# Patient Record
Sex: Male | Born: 1985 | Race: Black or African American | Hispanic: No | Smoking: Former smoker
Health system: Southern US, Community
[De-identification: ages and names within clinical notes are randomized; demographics above are authoritative.]

## PROBLEM LIST (undated history)

## (undated) HISTORY — PX: APPENDECTOMY: SHX54

## (undated) HISTORY — PX: HERNIA REPAIR: SHX51

---

## 2019-07-06 ENCOUNTER — Emergency Department (HOSPITAL_COMMUNITY): Payer: Self-pay

## 2019-07-06 ENCOUNTER — Encounter (HOSPITAL_COMMUNITY): Payer: Self-pay | Admitting: Radiology

## 2019-07-06 ENCOUNTER — Emergency Department (HOSPITAL_COMMUNITY)
Admission: EM | Admit: 2019-07-06 | Discharge: 2019-07-06 | Disposition: A | Discharge status: Home / Self Care | Payer: Self-pay | Attending: Emergency Medicine | Admitting: Emergency Medicine

## 2019-07-06 DIAGNOSIS — R198 Other specified symptoms and signs involving the digestive system and abdomen: Secondary | ICD-10-CM

## 2019-07-06 DIAGNOSIS — K228 Other specified diseases of esophagus: Secondary | ICD-10-CM | POA: Insufficient documentation

## 2019-07-06 DIAGNOSIS — R05 Cough: Secondary | ICD-10-CM | POA: Insufficient documentation

## 2019-07-06 DIAGNOSIS — J029 Acute pharyngitis, unspecified: Secondary | ICD-10-CM | POA: Insufficient documentation

## 2019-07-06 DIAGNOSIS — R042 Hemoptysis: Secondary | ICD-10-CM | POA: Insufficient documentation

## 2019-07-06 LAB — BASIC METABOLIC PANEL
Anion gap: 13 (ref 5–15)
BUN: 11 mg/dL (ref 6–20)
CO2: 24 mmol/L (ref 22–32)
Calcium: 9.5 mg/dL (ref 8.9–10.3)
Chloride: 103 mmol/L (ref 98–111)
Creatinine, Ser: 0.75 mg/dL (ref 0.61–1.24)
GFR calc Af Amer: >60 mL/min (ref >60)
GFR calc non Af Amer: >60 mL/min (ref >60)
Glucose, Bld: 83 mg/dL (ref 70–99)
Potassium: 3.9 mmol/L (ref 3.5–5.1)
Sodium: 140 mmol/L (ref 135–145)

## 2019-07-06 LAB — CBC WITH DIFFERENTIAL/PLATELET
Abs Immature Granulocytes: 0 10*3/uL (ref 0.00–0.07)
Basophils Absolute: 0 10*3/uL (ref 0.0–0.1)
Basophils Relative: 0 %
Eosinophils Absolute: 0.1 10*3/uL (ref 0.0–0.5)
Eosinophils Relative: 3 %
HCT: 45.2 % (ref 39.0–52.0)
Hemoglobin: 14.6 g/dL (ref 13.0–17.0)
Immature Granulocytes: 0 %
Lymphocytes Relative: 63 %
Lymphs Abs: 2.3 10*3/uL (ref 0.7–4.0)
MCH: 26.2 pg (ref 26.0–34.0)
MCHC: 32.3 g/dL (ref 30.0–36.0)
MCV: 81.1 fL (ref 80.0–100.0)
Monocytes Absolute: 0.4 10*3/uL (ref 0.1–1.0)
Monocytes Relative: 9 %
Neutro Abs: 1 10*3/uL — ABNORMAL LOW (ref 1.7–7.7)
Neutrophils Relative %: 25 %
Platelets: 252 10*3/uL (ref 150–400)
RBC: 5.57 MIL/uL (ref 4.22–5.81)
RDW: 13.5 % (ref 11.5–15.5)
WBC: 3.7 10*3/uL — ABNORMAL LOW (ref 4.0–10.5)
nRBC: 0 % (ref 0.0–0.2)

## 2019-07-06 LAB — GROUP A STREP BY PCR: Group A Strep by PCR: NOT DETECTED

## 2019-07-06 MED ORDER — PREDNISONE 10 MG PO TABS: 20.0000 mg | ORAL_TABLET | Freq: Every day | ORAL | 0 refills | Status: AC

## 2019-07-06 MED ORDER — KETOROLAC TROMETHAMINE 30 MG/ML IJ SOLN
30.0000 mg | Freq: Once | INTRAMUSCULAR | Status: AC
  Administered 2019-07-06: 30 mg via INTRAVENOUS
  Filled 2019-07-06: qty 1

## 2019-07-06 MED ORDER — DEXAMETHASONE SODIUM PHOSPHATE 10 MG/ML IJ SOLN
10.0000 mg | Freq: Once | INTRAMUSCULAR | Status: AC
  Administered 2019-07-06: 10 mg via INTRAVENOUS
  Filled 2019-07-06: qty 1

## 2019-07-06 MED ORDER — IOHEXOL 300 MG/ML  SOLN
75.0000 mL | Freq: Once | INTRAMUSCULAR | Status: AC | PRN
  Administered 2019-07-06: 75 mL via INTRAVENOUS

## 2019-07-06 MED ORDER — ALUM & MAG HYDROXIDE-SIMETH 200-200-20 MG/5ML PO SUSP
30.0000 mL | Freq: Once | ORAL | Status: AC
  Administered 2019-07-06: 15:00:00 30 mL via ORAL
  Filled 2019-07-06: qty 30

## 2019-07-06 MED ORDER — LIDOCAINE VISCOUS HCL 2 % MT SOLN
15.0000 mL | Freq: Once | OROMUCOSAL | Status: AC
  Administered 2019-07-06: 15:00:00 15 mL via ORAL
  Filled 2019-07-06: qty 15

## 2019-07-06 NOTE — ED Notes (Signed)
Patient transported to X-ray 

## 2019-07-06 NOTE — ED Provider Notes (Signed)
[facility] Provider Note   CSN: [id number] Arrival date & time: 07/06/19  1357    History   Chief Complaint Chief Complaint  Patient presents with   Sore Throat   HPI [name] is a 33 y.o. male who presents for evaluation of sore throat.  Patient states he got off of work at approximately 330 this morning.  States he has had throat pain since then.  Patient states it hurts when he swallows and he feels like something is "stuck in my throat."  He denies prior history of strep infections.  Patient states when he was brushing his teeth he had an episode of hemoptysis.  States this was bright red in nature.  He denies any fever, chills, nausea, vomiting, neck pain, neck stiffness, congestion, rhinorrhea, nausea, vomiting, chest pain, shortness of breath, vomiting, diarrhea.  He has no risk factors for TB.  He denies any abdominal pain.  Rates his current throat pain a 5/10.  Denies additional aggravating or alleviating factors.  He has not tried any oral intake since the symptoms began however he was able to tolerate liquids after his dinner yesterday up until 3 AM this morning.  History obtained from patient and past medical records.  No interpreter is used.     HPI  History reviewed. No pertinent past medical history.  There are no active problems to display for this patient.  History reviewed     Home Medications    Prior to Admission medications   Not on File    Family History No family history on file.  Social History Social History   Tobacco Use   Smoking status: Not on file  Substance Use Topics   Alcohol use: Not on file   Drug use: Not on file     Allergies   Patient has no allergy information on record.   Review of Systems Review of Systems  Constitutional: Negative.   HENT: Positive for sore throat.   Respiratory: Positive for cough. Negative for apnea, choking, chest tightness, shortness of  breath, wheezing and stridor.        Hemoptysis  Cardiovascular: Negative.   Gastrointestinal: Negative.   Genitourinary: Negative.   Musculoskeletal: Negative.   Skin: Negative.   Neurological: Negative.   All other systems reviewed and are negative.   Physical Exam Updated Vital Signs BP (!) 135/92 (BP Location: Right Arm)    Pulse 70    Temp 98.4 F (36.9 C) (Oral)    Resp 16    SpO2 100%   Physical Exam Vitals signs and nursing note reviewed.  Constitutional:      General: He is not in acute distress.    Appearance: He is well-developed. He is not ill-appearing, toxic-appearing or diaphoretic.  HENT:     Head: Normocephalic and atraumatic.     Jaw: There is normal jaw occlusion.     Mouth/Throat:     Lips: Pink.     Mouth: Mucous membranes are moist.     Tongue: No lesions. Tongue does not deviate from midline.     Pharynx: Uvula midline. Posterior oropharyngeal erythema present. No oropharyngeal exudate or uvula swelling.     Tonsils: No tonsillar exudate or tonsillar abscesses. 2+ on the right. 2+ on the left.     Comments: Posterior oropharynx erythematous.  Uvula midline without deviation.  He does have bilateral tonsillar swelling at 2+ bilaterally.  Scant exudate on left tonsil.  Sublingual area soft.  No  drooling, dysphagia or trismus.  No facial swelling or intra oral swelling. Eyes:     Pupils: Pupils are equal, round, and reactive to light.  Neck:     Musculoskeletal: Full passive range of motion without pain, normal range of motion and neck supple.     Trachea: Trachea and phonation normal.     Comments: No neck stiffness or neck rigidity Cardiovascular:     Rate and Rhythm: Normal rate and regular rhythm.     Pulses: Normal pulses.     Heart sounds: Normal heart sounds.  Pulmonary:     Effort: Pulmonary effort is normal. No respiratory distress.     Breath sounds: Normal breath sounds and air entry.  Abdominal:     General: Bowel sounds are normal. There  is no distension.     Palpations: Abdomen is soft.     Tenderness: There is no abdominal tenderness.  Musculoskeletal: Normal range of motion.     Comments: Moves all 4 extremities without difficulty.  Skin:    General: Skin is warm and dry.     Capillary Refill: Capillary refill takes less than 2 seconds.     Comments: Brisk capillary refill.  No rashes or lesions.  Neurological:     Mental Status: He is alert.    ED Treatments / Results  Labs (all labs ordered are listed, but only abnormal results are displayed) Labs Reviewed  CBC WITH DIFFERENTIAL/PLATELET - Abnormal; Notable for the following components:      Result Value   WBC 3.7 (*)    Neutro Abs 1.0 (*)    All other components within normal limits  GROUP A STREP BY PCR  BASIC METABOLIC PANEL    EKG None  Radiology Dg Chest 2 View  Result Date: 07/06/2019 CLINICAL DATA:  Patient with rib pain. EXAM: CHEST - 2 VIEW COMPARISON:  None. FINDINGS: Normal cardiac and mediastinal contours. No consolidative pulmonary opacities. No pleural effusion or pneumothorax. Regional skeleton is unremarkable. IMPRESSION: No acute cardiopulmonary process. Electronically Signed   By: Lovey Newcomer M.D.   On: 07/06/2019 15:09    Procedures Procedures (including critical care time)  Medications Ordered in ED Medications  alum & mag hydroxide-simeth (MAALOX/MYLANTA) 200-200-20 MG/5ML suspension 30 mL (30 mLs Oral Given 07/06/19 1442)    And  lidocaine (XYLOCAINE) 2 % viscous mouth solution 15 mL (15 mLs Oral Given 07/06/19 1442)  dexamethasone (DECADRON) injection 10 mg (10 mg Intravenous Given 07/06/19 1551)  ketorolac (TORADOL) 30 MG/ML injection 30 mg (30 mg Intravenous Given 07/06/19 1551)  iohexol (OMNIPAQUE) 300 MG/ML solution 75 mL (75 mLs Intravenous Contrast Given 07/06/19 1625)    Initial Impression / Assessment and Plan / ED Course  I have reviewed the triage vital signs and the nursing notes.  Pertinent labs & imaging results  that were available during my care of the patient were reviewed by me and considered in my medical decision making (see chart for details).  33 year old male appears otherwise well presents for evaluation of sore throat and one episode of hemoptysis.  He is afebrile, nonseptic, non-ill-appearing.  Symptoms began approximately 10 hours PTA.  States he was able to eat dinner without difficulty yesterday was able to tolerate liquids after he last ate solid food however has not tried any p.o. intake since his sore throat began at 430 this morning.  States he did have one episode of hemoptysis while he was brushing his teeth.  His posterior oropharynx is visibly erythematous.  His  tonsils are 2+ bilaterally with scant exudate on left.  His uvula is midline without deviation.  Sublingual area soft.  No drooling, dysphagia or trismus.  He does feel like he has something "stuck in my throat."  Will obtain basic labs, strep, chest x-ray.  CBC with mild leukopenia at 3.7, to [facility] panel without electrolyte, renal or liver abnormality Strep test negative. Patient was able to tolerate GI cocktail without difficulty. Patient states he still has pain with swallowing.   Plan for CT soft tissue neck with contrast.  I have low suspicion for food bolus as cause of his sore throat given he was able to tolerate p.o. liquids after his last food intake.  He is able to tolerate his secretions without difficulty has tolerated his GI cocktail without difficulty.  1600: Care transferred to [facility], PA-C who will follow up on CT scan and PO challenge. She will determine ultimate treatment, plan and disposition.      Final Clinical Impressions(s) / ED Diagnoses   Final diagnoses:  Sore throat  Sensation of foreign body in esophagus    ED Discharge Orders    None       Zamzam Whinery A, PA-C 07/06/19 1646    Tegeler, Canary Brim, MD 07/07/19 0830

## 2019-07-06 NOTE — ED Provider Notes (Signed)
Care assumed from PA Henderly at shift change. Please see her note for full HPI  In short, patient is a 33 year old male who presents to the ED for sore throat x 1 day. Patient notes swallowing causes pain and it feels as if something is stuck in his throat. He had one episode of hemoptysis while brushing his teeth earlier this morning. He denies URI symptoms.   Physical Exam  BP (!) 135/92 (BP Location: Right Arm)   Pulse 70   Temp 98.4 F (36.9 C) (Oral)   Resp 16   SpO2 100%   Physical Exam Vitals signs and nursing note reviewed.  Constitutional:      General: He is not in acute distress.    Appearance: He is not toxic-appearing.  HENT:     Head: Normocephalic.     Mouth/Throat:     Mouth: Mucous membranes are moist.     Pharynx: Posterior oropharyngeal erythema present.     Comments: Uvula midline, but edematous. 2+ tonsils bilaterally. Erythematous posterior oropharyngeal. No abscess noted. Tolerating oral secretions without difficulty. Normal phonation. Eyes:     General:        Right eye: No discharge.        Left eye: No discharge.     Conjunctiva/sclera: Conjunctivae normal.  Neck:     Musculoskeletal: Neck supple.  Cardiovascular:     Rate and Rhythm: Normal rate and regular rhythm.     Pulses: Normal pulses.     Heart sounds: Normal heart sounds. No murmur. No friction rub. No gallop.   Pulmonary:     Effort: Pulmonary effort is normal.     Breath sounds: Normal breath sounds.  Abdominal:     General: Abdomen is flat. Bowel sounds are normal. There is no distension.     Palpations: Abdomen is soft.  Skin:    General: Skin is warm and dry.  Neurological:     General: No focal deficit present.     Mental Status: He is alert.     ED Course/Procedures     Procedures  MDM  CT scan of neck personally reviewed which demonstrates severe swelling of uvula which increases suspicion for angioedema rather than infectious process given absence of mucosal  enhancement. Patient denies new medications or new foods. Patient already given decadron 10mg .   5:54 PM Discussed case with Dr. Alvino Chapel who evaluated patient and agrees with assessment and plan.  Upon reevaluation, patient notes his pain has improved. Patient still denies difficulties breathing and is tolerating oral secretions without difficulty. Patient given food and water which he was able to swallow without difficulty. Will treat symptomatically with prednisone x 5 days. ENT and [place] [facility] number given to patient at discharge. Patient instructed to call ENT on Monday to schedule an appointment if symptoms do not improve. Strict ED precautions discussed with patient. Patient states understanding and agrees to plan. Patient discharged home in no acute distress and vitals within normal limits.        Romie Levee 07/07/19 0401    Davonna Belling, MD 07/07/19 1059

## 2019-07-06 NOTE — Discharge Instructions (Addendum)
I am sending you home with a steroid to help with inflammation. Take as prescribed. I have included the number for the ENT doctor. Please call on Monday to schedule an appointment if symptoms do not improve. The steroids you were given here in the ER and the ones you are taking at home should help significantly to reduce the swelling. I am also including a primary care doctor. You can get voltaren gel at your local pharmacy for your back pain. Over the counter is typically cheaper than a prescription for the gel. Return to the ER if you are unable to swallow, have a hard time breathing, or symptoms worsen.

## 2019-07-06 NOTE — ED Triage Notes (Signed)
Pt presents to the ED with throat pain that started yesterday and also reports that it feels like "something is stuck" in his throat. Pt reports he woke up an hour ago and reports that he coughed up a lot of blood. Pt also reporting lower back pain that started this morning.

## 2020-04-04 ENCOUNTER — Encounter (HOSPITAL_COMMUNITY): Payer: Self-pay | Admitting: Emergency Medicine

## 2020-04-04 ENCOUNTER — Other Ambulatory Visit: Payer: Self-pay

## 2020-04-04 ENCOUNTER — Emergency Department (HOSPITAL_COMMUNITY)
Admission: EM | Admit: 2020-04-04 | Discharge: 2020-04-05 | Disposition: A | Discharge status: Home / Self Care | Payer: BC Managed Care – PPO | Attending: Emergency Medicine | Admitting: Emergency Medicine

## 2020-04-04 DIAGNOSIS — R109 Unspecified abdominal pain: Secondary | ICD-10-CM | POA: Insufficient documentation

## 2020-04-04 DIAGNOSIS — Z5321 Procedure and treatment not carried out due to patient leaving prior to being seen by health care provider: Secondary | ICD-10-CM | POA: Diagnosis not present

## 2020-04-04 DIAGNOSIS — M549 Dorsalgia, unspecified: Secondary | ICD-10-CM | POA: Diagnosis not present

## 2020-04-04 LAB — URINALYSIS, ROUTINE W REFLEX MICROSCOPIC
Bacteria, UA: NONE SEEN
Bilirubin Urine: NEGATIVE
Glucose, UA: NEGATIVE mg/dL
Ketones, ur: NEGATIVE mg/dL
Leukocytes,Ua: NEGATIVE
Nitrite: NEGATIVE
Protein, ur: NEGATIVE mg/dL
Specific Gravity, Urine: 1.021 (ref 1.005–1.030)
pH: 5 (ref 5.0–8.0)

## 2020-04-04 LAB — COMPREHENSIVE METABOLIC PANEL
ALT: 57 U/L — ABNORMAL HIGH (ref 0–44)
AST: 40 U/L (ref 15–41)
Albumin: 4.3 g/dL (ref 3.5–5.0)
Alkaline Phosphatase: 57 U/L (ref 38–126)
Anion gap: 12 (ref 5–15)
BUN: 9 mg/dL (ref 6–20)
CO2: 28 mmol/L (ref 22–32)
Calcium: 9.6 mg/dL (ref 8.9–10.3)
Chloride: 99 mmol/L (ref 98–111)
Creatinine, Ser: 0.9 mg/dL (ref 0.61–1.24)
GFR calc Af Amer: >60 mL/min (ref >60)
GFR calc non Af Amer: >60 mL/min (ref >60)
Glucose, Bld: 116 mg/dL — ABNORMAL HIGH (ref 70–99)
Potassium: 3.4 mmol/L — ABNORMAL LOW (ref 3.5–5.1)
Sodium: 139 mmol/L (ref 135–145)
Total Bilirubin: 0.7 mg/dL (ref 0.3–1.2)
Total Protein: 7.1 g/dL (ref 6.5–8.1)

## 2020-04-04 LAB — LIPASE, BLOOD: Lipase: 28 U/L (ref 11–51)

## 2020-04-04 LAB — CBC
HCT: 44.2 % (ref 39.0–52.0)
Hemoglobin: 14 g/dL (ref 13.0–17.0)
MCH: 25.8 pg — ABNORMAL LOW (ref 26.0–34.0)
MCHC: 31.7 g/dL (ref 30.0–36.0)
MCV: 81.5 fL (ref 80.0–100.0)
Platelets: 203 10*3/uL (ref 150–400)
RBC: 5.42 MIL/uL (ref 4.22–5.81)
RDW: 13 % (ref 11.5–15.5)
WBC: 2.9 10*3/uL — ABNORMAL LOW (ref 4.0–10.5)
nRBC: 0 % (ref 0.0–0.2)

## 2020-04-04 MED ORDER — SODIUM CHLORIDE 0.9% FLUSH: 3.0000 mL | Freq: Once | INTRAVENOUS | Status: DC

## 2020-04-04 NOTE — ED Triage Notes (Signed)
Pt coming from home. Pt complaining of abdominal pain and back pain for 1 week. Pt states had same approx 1 month ago. Pt states took ibuprofen to alleviate pain then. Pt A&Ox4. VSS.

## 2020-04-05 NOTE — ED Notes (Signed)
Pt stated that he is not waiting any longer. Pt has walked out of the ED lobby.

## 2020-04-15 ENCOUNTER — Ambulatory Visit (HOSPITAL_COMMUNITY)
Admission: EM | Admit: 2020-04-15 | Discharge: 2020-04-15 | Disposition: A | Discharge status: Home / Self Care | Payer: BC Managed Care – PPO | Attending: Family Medicine | Admitting: Family Medicine

## 2020-04-15 ENCOUNTER — Ambulatory Visit (INDEPENDENT_AMBULATORY_CARE_PROVIDER_SITE_OTHER): Payer: BC Managed Care – PPO

## 2020-04-15 ENCOUNTER — Encounter (HOSPITAL_COMMUNITY): Payer: Self-pay | Admitting: Emergency Medicine

## 2020-04-15 ENCOUNTER — Other Ambulatory Visit: Payer: Self-pay

## 2020-04-15 DIAGNOSIS — M545 Low back pain, unspecified: Secondary | ICD-10-CM

## 2020-04-15 DIAGNOSIS — Z20822 Contact with and (suspected) exposure to covid-19: Secondary | ICD-10-CM | POA: Insufficient documentation

## 2020-04-15 DIAGNOSIS — R1030 Lower abdominal pain, unspecified: Secondary | ICD-10-CM

## 2020-04-15 DIAGNOSIS — B360 Pityriasis versicolor: Secondary | ICD-10-CM | POA: Diagnosis present

## 2020-04-15 LAB — COMPREHENSIVE METABOLIC PANEL
ALT: 69 U/L — ABNORMAL HIGH (ref 0–44)
AST: 41 U/L (ref 15–41)
Albumin: 4.5 g/dL (ref 3.5–5.0)
Alkaline Phosphatase: 60 U/L (ref 38–126)
Anion gap: 9 (ref 5–15)
BUN: 11 mg/dL (ref 6–20)
CO2: 29 mmol/L (ref 22–32)
Calcium: 9.9 mg/dL (ref 8.9–10.3)
Chloride: 100 mmol/L (ref 98–111)
Creatinine, Ser: 0.85 mg/dL (ref 0.61–1.24)
GFR calc Af Amer: >60 mL/min (ref >60)
GFR calc non Af Amer: >60 mL/min (ref >60)
Glucose, Bld: 93 mg/dL (ref 70–99)
Potassium: 3.9 mmol/L (ref 3.5–5.1)
Sodium: 138 mmol/L (ref 135–145)
Total Bilirubin: 0.4 mg/dL (ref 0.3–1.2)
Total Protein: 7.5 g/dL (ref 6.5–8.1)

## 2020-04-15 LAB — POCT URINALYSIS DIPSTICK, ED / UC
Bilirubin Urine: NEGATIVE
Glucose, UA: NEGATIVE mg/dL
Ketones, ur: NEGATIVE mg/dL
Leukocytes,Ua: NEGATIVE
Nitrite: NEGATIVE
Protein, ur: NEGATIVE mg/dL
Specific Gravity, Urine: >=1.03 (ref 1.005–1.030)
Urobilinogen, UA: 0.2 mg/dL (ref 0.0–1.0)
pH: 5.5 (ref 5.0–8.0)

## 2020-04-15 LAB — CBC WITH DIFFERENTIAL/PLATELET
Abs Immature Granulocytes: 0.03 10*3/uL (ref 0.00–0.07)
Basophils Absolute: 0 10*3/uL (ref 0.0–0.1)
Basophils Relative: 0 %
Eosinophils Absolute: 0.1 10*3/uL (ref 0.0–0.5)
Eosinophils Relative: 1 %
HCT: 43.8 % (ref 39.0–52.0)
Hemoglobin: 14.2 g/dL (ref 13.0–17.0)
Immature Granulocytes: 0 %
Lymphocytes Relative: 33 %
Lymphs Abs: 2.3 10*3/uL (ref 0.7–4.0)
MCH: 26.2 pg (ref 26.0–34.0)
MCHC: 32.4 g/dL (ref 30.0–36.0)
MCV: 80.8 fL (ref 80.0–100.0)
Monocytes Absolute: 0.4 10*3/uL (ref 0.1–1.0)
Monocytes Relative: 6 %
Neutro Abs: 4.1 10*3/uL (ref 1.7–7.7)
Neutrophils Relative %: 60 %
Platelets: 273 10*3/uL (ref 150–400)
RBC: 5.42 MIL/uL (ref 4.22–5.81)
RDW: 12.8 % (ref 11.5–15.5)
WBC: 6.9 10*3/uL (ref 4.0–10.5)
nRBC: 0 % (ref 0.0–0.2)

## 2020-04-15 MED ORDER — KETOROLAC TROMETHAMINE 60 MG/2ML IM SOLN
INTRAMUSCULAR | Status: AC
  Filled 2020-04-15: qty 2

## 2020-04-15 MED ORDER — KETOCONAZOLE 1 % EX SHAM: MEDICATED_SHAMPOO | CUTANEOUS | 0 refills | Status: AC

## 2020-04-15 MED ORDER — KETOROLAC TROMETHAMINE 60 MG/2ML IM SOLN
60.0000 mg | Freq: Once | INTRAMUSCULAR | Status: AC
  Administered 2020-04-15: 60 mg via INTRAMUSCULAR

## 2020-04-15 NOTE — ED Notes (Signed)
Patient transported to X-ray 

## 2020-04-15 NOTE — ED Provider Notes (Addendum)
[facility]    CSN: [id number] Arrival date & time: 04/15/20  1101      History   Chief Complaint Chief Complaint  Patient presents with  . Back Pain    HPI Garrett Rosales is a 34 y.o. male.   Garrett Rosales presents with complaints of low abdominal pain which radiates around sides/flank to low back. Low back pain radiates up back. Back pain started a month ago. Abdominal pain worsened this morning and more localized to lower abdomen bilaterally. Abdominal pain started at bilateral flanks a few days ago. No nausea, vomiting or diarrhea. No pain with urination. Walking worsens abdominal pain and back pain. No blood to urine. Temp last week, unsure how high, no fevers since. No previous similar abdominal pain, no previous surgeries. Took over the counter pain medications for pain which haven't helped- took tylenol last night. Denies any constipation. Has been able to eat and drink. No known injury to back. Went to the ER 8/7 but LWBS due to wait time, with basic labs collected at that time. He feels he has since worsened.    ROS per HPI, negative if not otherwise mentioned.      History reviewed. No pertinent past medical history.  There are no problems to display for this patient.   History reviewed. No pertinent surgical history.     Home Medications    Prior to Admission medications   Medication Sig Start Date End Date Taking? Authorizing Provider  KETOCONAZOLE, TOPICAL, 1 % SHAM Apply to affected rash, leave on for 5 minutes and rinse. Three times a week until it has resolved. 04/15/20   Georgetta Haber, NP    Family History History reviewed. No pertinent family history.  Social History Social History   Tobacco Use  . Smoking status: Former Games developer  . Smokeless tobacco: Never Used  Substance Use Topics  . Alcohol use: Not Currently  . Drug use: Never     Allergies   Aspirin   Review of Systems Review of Systems   Physical  Exam Triage Vital Signs ED Triage Vitals  Enc Vitals Group     BP 04/15/20 1253 121/87     Pulse Rate 04/15/20 1253 70     Resp 04/15/20 1253 20     Temp 04/15/20 1253 98.4 F (36.9 C)     Temp Source 04/15/20 1253 Oral     SpO2 04/15/20 1253 99 %     Weight --      Height --      Head Circumference --      Peak Flow --      Pain Score 04/15/20 1249 9     Pain Loc --      Pain Edu? --      Excl. in GC? --    No data found.  Updated Vital Signs BP 121/87 (BP Location: Left Arm)   Pulse 70   Temp 98.4 F (36.9 C) (Oral)   Resp 20   SpO2 99%    Physical Exam Constitutional:      Appearance: He is well-developed.  Cardiovascular:     Rate and Rhythm: Normal rate.  Pulmonary:     Effort: Pulmonary effort is normal.  Abdominal:     General: There is distension.     Tenderness: There is abdominal tenderness. There is right CVA tenderness and left CVA tenderness. There is no guarding or rebound.     Comments: Movements seem to worsen pain  Skin:    General: Skin is warm and dry.     Comments: Hypopigmented lesions to chest, neck and to beard  Neurological:     Mental Status: He is alert and oriented to person, place, and time.      UC Treatments / Results  Labs (all labs ordered are listed, but only abnormal results are displayed) Labs Reviewed  COMPREHENSIVE METABOLIC PANEL - Abnormal; Notable for the following components:      Result Value   ALT 69 (*)    All other components within normal limits  POCT URINALYSIS DIPSTICK, ED / UC - Abnormal; Notable for the following components:   Hgb urine dipstick TRACE (*)    All other components within normal limits  URINE CULTURE  CBC WITH DIFFERENTIAL/PLATELET    EKG   Radiology DG Abd 2 Views  Result Date: 04/15/2020 CLINICAL DATA:  Abdominal pain for 1 week. EXAM: ABDOMEN - 2 VIEW COMPARISON:  None. FINDINGS: The bowel gas pattern is normal. There is no evidence of free air. No radio-opaque calculi or other  significant radiographic abnormality is seen. IMPRESSION: Negative exam. Electronically Signed   By: Drusilla Kanner M.D.   On: 04/15/2020 13:51    Procedures Procedures (including critical care time)  Medications Ordered in UC Medications  ketorolac (TORADOL) injection 60 mg (60 mg Intramuscular Given 04/15/20 1334)    Initial Impression / Assessment and Plan / UC Course  I have reviewed the triage vital signs and the nursing notes.  Pertinent labs & imaging results that were available during my care of the patient were reviewed by me and considered in my medical decision making (see chart for details).     Persistent low abdominal pain, 10/10 in severity initially, still 8/10 after Toradol provided. Pain with getting off of the exam table, pain with palpation. Trace hgb to urine, consistent with previous UA in the ED. WBC remains stable, and afebrile. Abdominal film normal here today. Nephrolithiasis vs diverticulitis vs appendicitis considered. With severity of pain I do recommend further evaluation, likely will need further imaging, in the ER. Patient states due to wait times he may delay depending on his pain level. Risks of this discussed. Patient verbalized understanding and agreeable to plan.    After completion of visit patient states he is required to get covid testing before returning to work, collected and pending as well.   Final Clinical Impressions(s) / UC Diagnoses   Final diagnoses:  Lower abdominal pain  Acute bilateral low back pain without sciatica  Tinea versicolor     Discharge Instructions     I am worried about your persistent abdominal pain.  Your labs and work up today are generally unremarkable, therefore I do feel that further evaluation of your pain is warranted, especially if it worsens despite the medication we have given you here today.     ED Prescriptions    Medication Sig Dispense Auth. Provider   KETOCONAZOLE, TOPICAL, 1 % SHAM Apply to  affected rash, leave on for 5 minutes and rinse. Three times a week until it has resolved. 200 mL Georgetta Haber, NP     PDMP not reviewed this encounter.   Georgetta Haber, NP 04/15/20 1511    Georgetta Haber, NP 04/15/20 1520

## 2020-04-15 NOTE — ED Notes (Signed)
Pt first discharge was undo as the pt requested a COVID test. Pt declined COVID test when the provider asked.

## 2020-04-15 NOTE — ED Triage Notes (Signed)
Patient went to ED on 8/7.  At that time complained of back pain and abdominal pain that started one week prior to this encounter.  Patient did not stay to be seen.   No nausea, no vomiting.  No diarrhea.

## 2020-04-15 NOTE — Discharge Instructions (Signed)
I am worried about your persistent abdominal pain.  Your labs and work up today are generally unremarkable, therefore I do feel that further evaluation of your pain is warranted, especially if it worsens despite the medication we have given you here today.

## 2020-04-16 LAB — URINE CULTURE: Culture: NO GROWTH

## 2020-04-16 LAB — SARS CORONAVIRUS 2 (TAT 6-24 HRS): SARS Coronavirus 2: NEGATIVE

## 2020-09-26 IMAGING — CT CT NECK W/ CM
4 of 5 series · 15 of 33 positions shown, 17 images · IV contrast (Omni 300)
Comparison: Chest radiographs earlier today.

CLINICAL DATA: 33-year-old male with sore throat, stridor, trouble
swallowing.

EXAM:
CT NECK WITH CONTRAST
TECHNIQUE: Multidetector CT imaging of the neck was performed using the
standard protocol following the bolus administration of intravenous
contrast.
CONTRAST:  75mL OMNIPAQUE IOHEXOL 300 MG/ML  SOLN

[Series 3: neck 2.0 st · axial · 0.50mm/px · z∈[-296,-152]mm · 4 of 122 slices shown, 5 images (1 of 3)]
[im 25/122  soft-tissue]
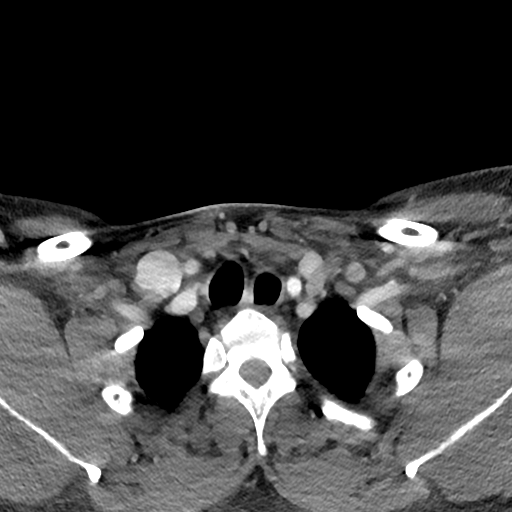
[im 25/122  bone]
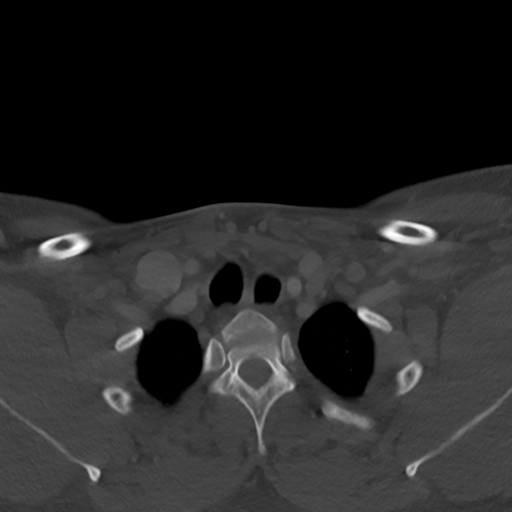
[im 49/122  bone]
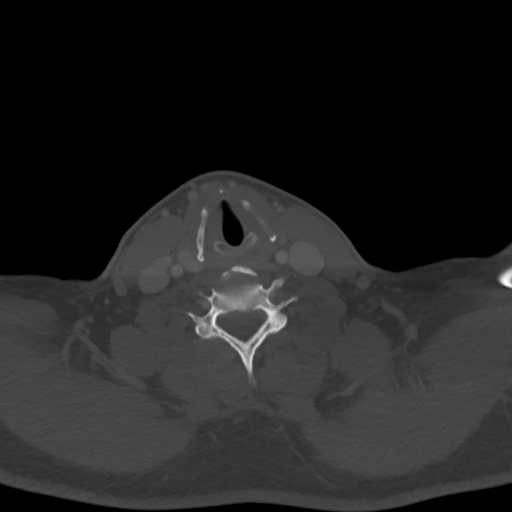
[im 73/122  bone]
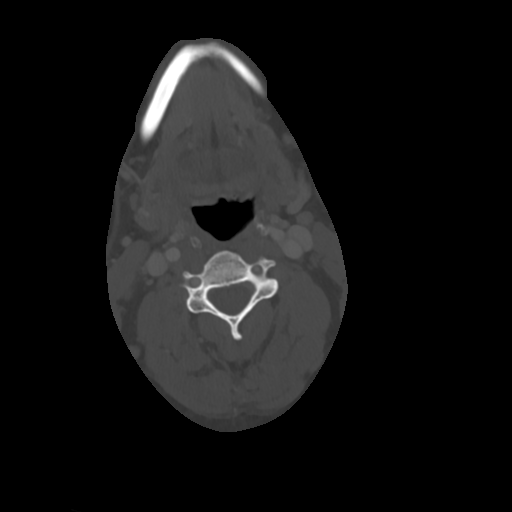
[im 97/122  bone]
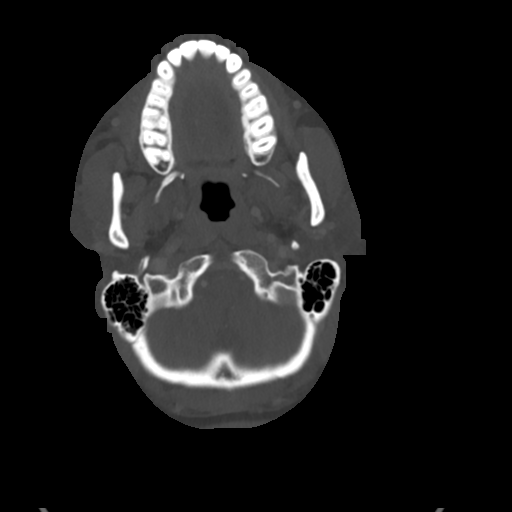

[Series 6: neck 2.0 st · sagittal · 0.52mm/px · 5 of 83 slices shown, 6 images (2 of 3)]
[im 28/83  bone]
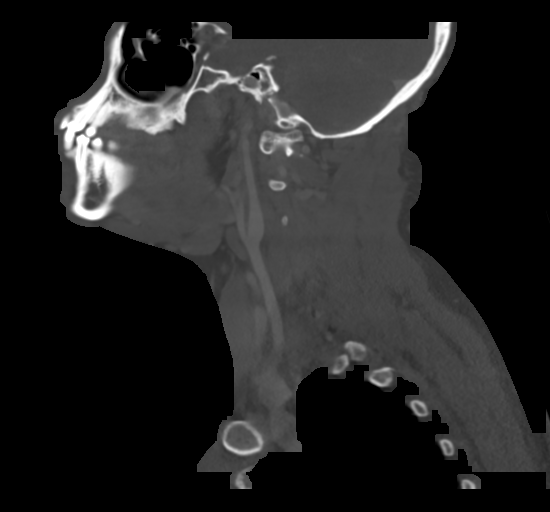
[im 35/83  bone]
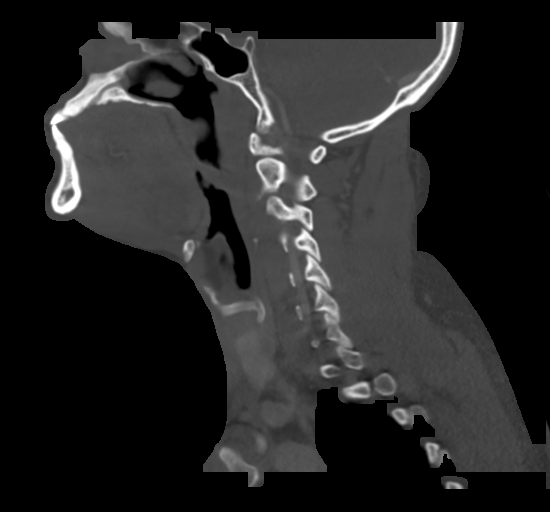
[im 42/83  soft-tissue]
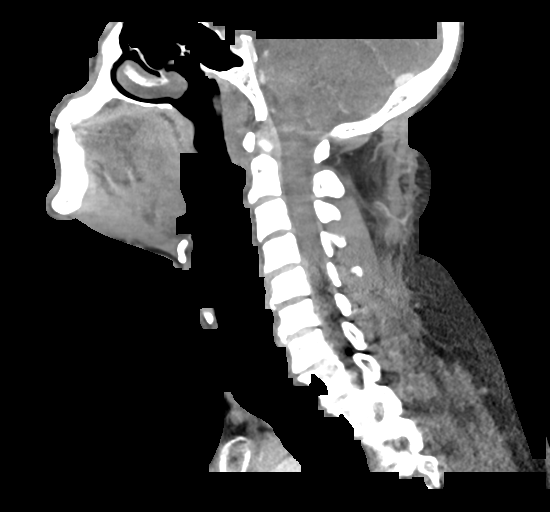
[im 42/83  bone]
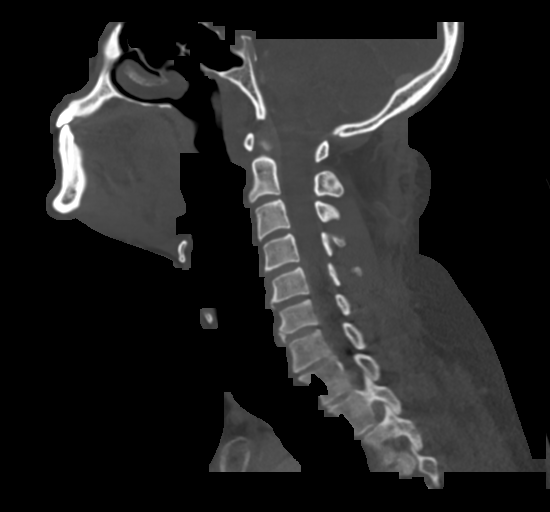
[im 48/83  bone]
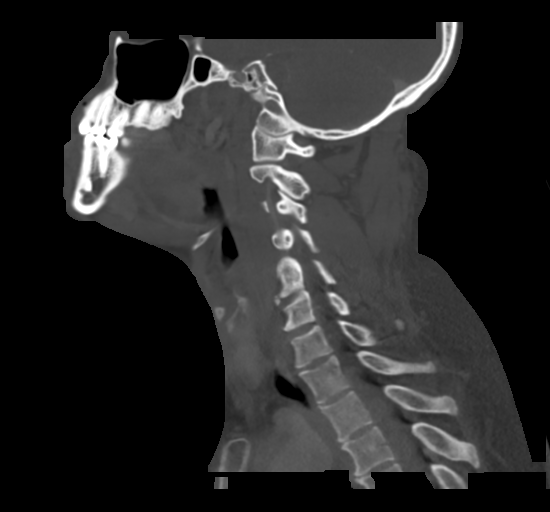
[im 55/83  bone]
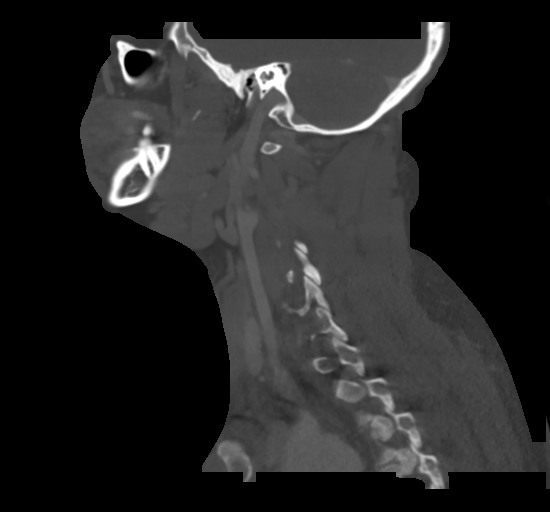

[Series 7: neck 2.0 st · coronal · 0.51mm/px · 3 of 116 slices shown (3 of 3)]
[im 24/116  bone]
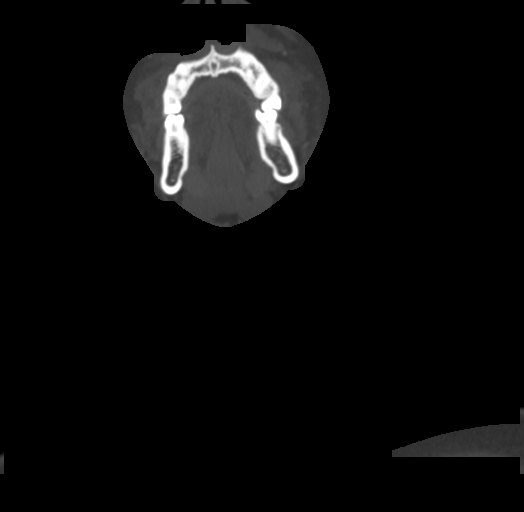
[im 47/116  bone]
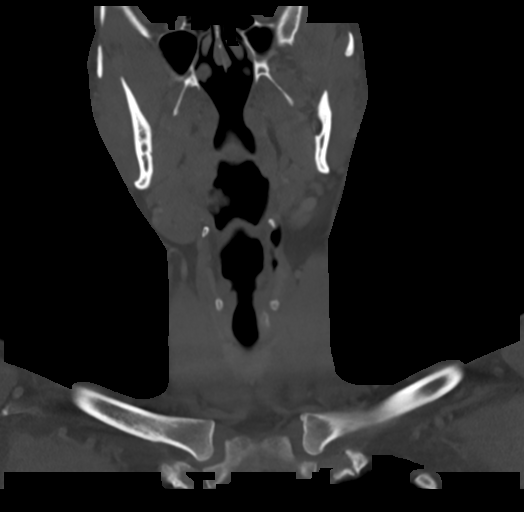
[im 70/116  bone]
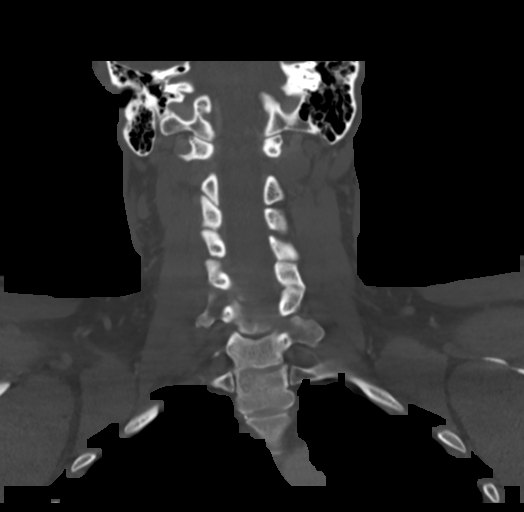

[Series 8: neck 2.0 st orthogonal · axial · 0.39mm/px · z∈[-322,-220]mm · 3 of 131 slices shown]
[im 27/131  bone]
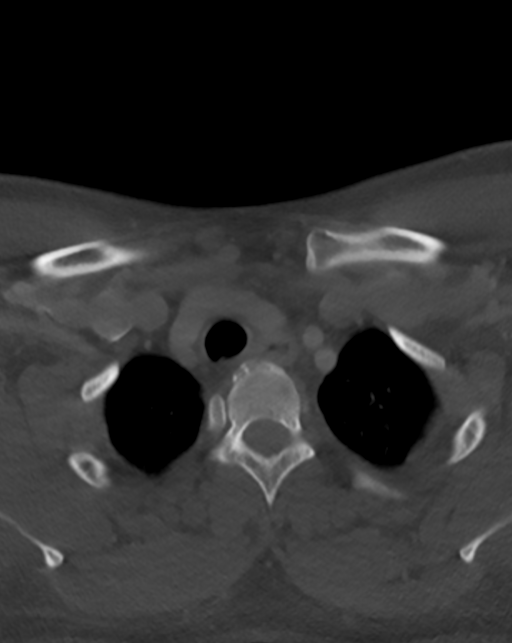
[im 53/131  bone]
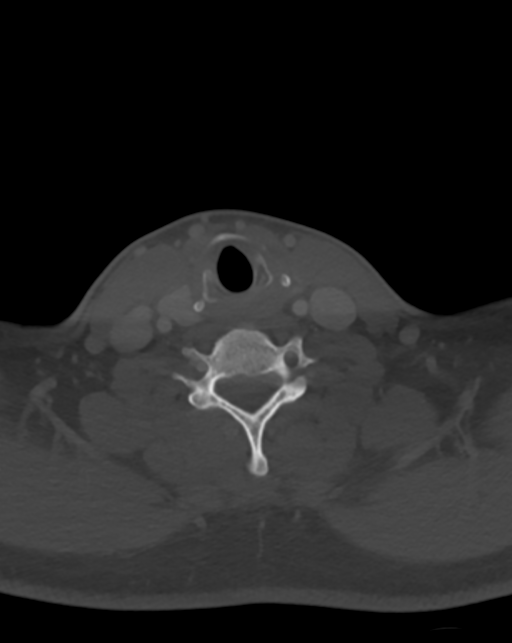
[im 79/131  bone]
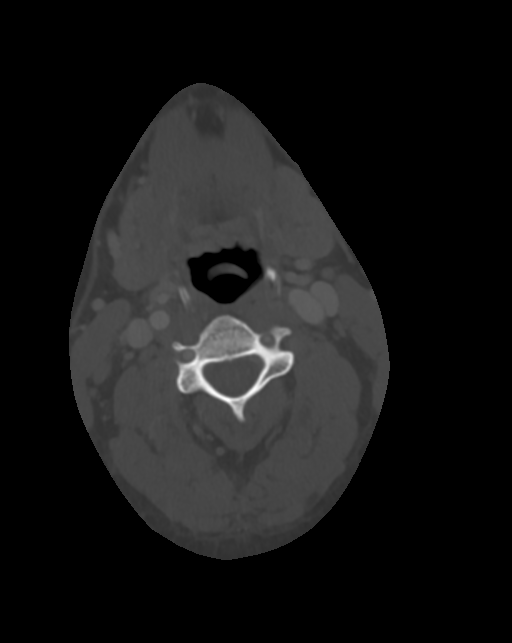

[15 of 33 positions shown; findings below may reference images not displayed]

FINDINGS: Pharynx and larynx: Negative larynx. The epiglottis is within normal
limits.

The epiglottis is swollen, up to 12 millimeters diameter on series
3, image 41. Otherwise relatively normal pharyngeal soft tissue
contours. No other swelling of the soft palate.

No palatine tonsil or adenoid hyperenhancement. The parapharyngeal
spaces remain normal. Mild if any hyperenhancement of the lingual
tonsils. Negative retropharyngeal space.

Salivary glands: Negative sublingual space. Submandibular glands
appear symmetric with mild ductal ectasia at the hila. No sublingual
enlargement of the submandibular ducts. No sialolithiasis
identified. Parotid glands appear symmetric and normal.

Thyroid: Negative.

Lymph nodes: No cervical lymphadenopathy. Bilateral cervical lymph
nodes appear symmetric and within normal limits.

Vascular: Major vascular structures in the neck and at the skull
base appear patent and normal. The right vertebral artery is
dominant.

Limited intracranial: Negative.

Visualized orbits: Negative.

Mastoids and visualized paranasal sinuses: Right maxillary sinus
mucous retention cyst, otherwise clear. Tympanic cavities and
mastoids are clear.

Skeleton: No dental abnormality identified. No osseous abnormality
identified.

Upper chest: Negative visible lung apices and superior mediastinum.
Incidental mild gaseous distension of the esophagus at the thoracic
inlet.
IMPRESSION: 1. Moderate to severe swelling of the uvula (12 mm diameter), but
otherwise the pharynx and larynx are within normal limits. Given the
absence of mucosal enhancement this might reflect angioedema of the
palate rather than infection.
2. Otherwise negative CT appearance of the neck.

## 2020-09-29 ENCOUNTER — Ambulatory Visit (HOSPITAL_COMMUNITY)
Admission: EM | Admit: 2020-09-29 | Discharge: 2020-09-29 | Disposition: A | Discharge status: Home / Self Care | Payer: BC Managed Care – PPO | Attending: Emergency Medicine | Admitting: Emergency Medicine

## 2020-09-29 ENCOUNTER — Encounter (HOSPITAL_COMMUNITY): Payer: Self-pay

## 2020-09-29 ENCOUNTER — Ambulatory Visit (INDEPENDENT_AMBULATORY_CARE_PROVIDER_SITE_OTHER): Payer: BC Managed Care – PPO

## 2020-09-29 ENCOUNTER — Other Ambulatory Visit: Payer: Self-pay

## 2020-09-29 DIAGNOSIS — R0781 Pleurodynia: Secondary | ICD-10-CM | POA: Diagnosis not present

## 2020-09-29 DIAGNOSIS — W19XXXA Unspecified fall, initial encounter: Secondary | ICD-10-CM | POA: Diagnosis not present

## 2020-09-29 DIAGNOSIS — S20211A Contusion of right front wall of thorax, initial encounter: Secondary | ICD-10-CM | POA: Diagnosis not present

## 2020-09-29 DIAGNOSIS — S0990XA Unspecified injury of head, initial encounter: Secondary | ICD-10-CM

## 2020-09-29 DIAGNOSIS — M62838 Other muscle spasm: Secondary | ICD-10-CM | POA: Diagnosis not present

## 2020-09-29 MED ORDER — TIZANIDINE HCL 4 MG PO TABS: 4.0000 mg | ORAL_TABLET | Freq: Three times a day (TID) | ORAL | 0 refills | Status: AC | PRN

## 2020-09-29 MED ORDER — IBUPROFEN 600 MG PO TABS: 600.0000 mg | ORAL_TABLET | Freq: Four times a day (QID) | ORAL | 0 refills | Status: AC | PRN

## 2020-09-29 NOTE — ED Triage Notes (Signed)
Pt presents with generalized body pain after falling on ice X 3 days ago.
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
## 2020-09-29 NOTE — Discharge Instructions (Addendum)
Your x-ray was negative for fracture.  Take 600 mg of ibuprofen combined with 1000 mg Tylenol together 3-4 times a day as needed for pain.  Zanaflex will help with the muscle spasms particularly in your neck.  Warm compresses, gentle massage to your neck.  Below is a list of primary care practices who are taking new patients for you to follow-up with.  [facility] internal medicine clinic Ground Floor - [facility], [address] [place], [place], [place] [address] [phone]  [facility] Primary Care at [facility] [address] [place], [place] [address] [phone]  [facility] [address] [place], [place] [address] [phone]  [facility] [phone] [address] [place] [address]  [facility]: [address] [place] [address]  [phone]  [facility]: [address] [place] [address]   [phone]  [facility]: [address] [place] [address]  [phone]  [facility] : [address] [place] [address] [phone]  [facility] Primary Care: [address] [place] [address] [phone]  [facility]: [address] [place] [address] [phone]  Dr. Oneal Grout [address] [facility] [place] [address]  [phone]  Dr. Jackie Plum, [facility]. [address] [place], [place] [address]  [phone]  Go to www.goodrx.com to look up your medications. This will give you a list of where you can find your prescriptions at the most affordable prices. Or ask the pharmacist what the cash price is, or if they have any other discount programs available to help make your medication more affordable.  This can be less expensive than what you would pay with insurance.

## 2021-07-07 IMAGING — DX DG ABDOMEN 2V
2 series · 2 of 2 positions shown · non-contrast
Comparison: None.

CLINICAL DATA: Abdominal pain for 1 week.

EXAM:
ABDOMEN - 2 VIEW

[abdomen erect]
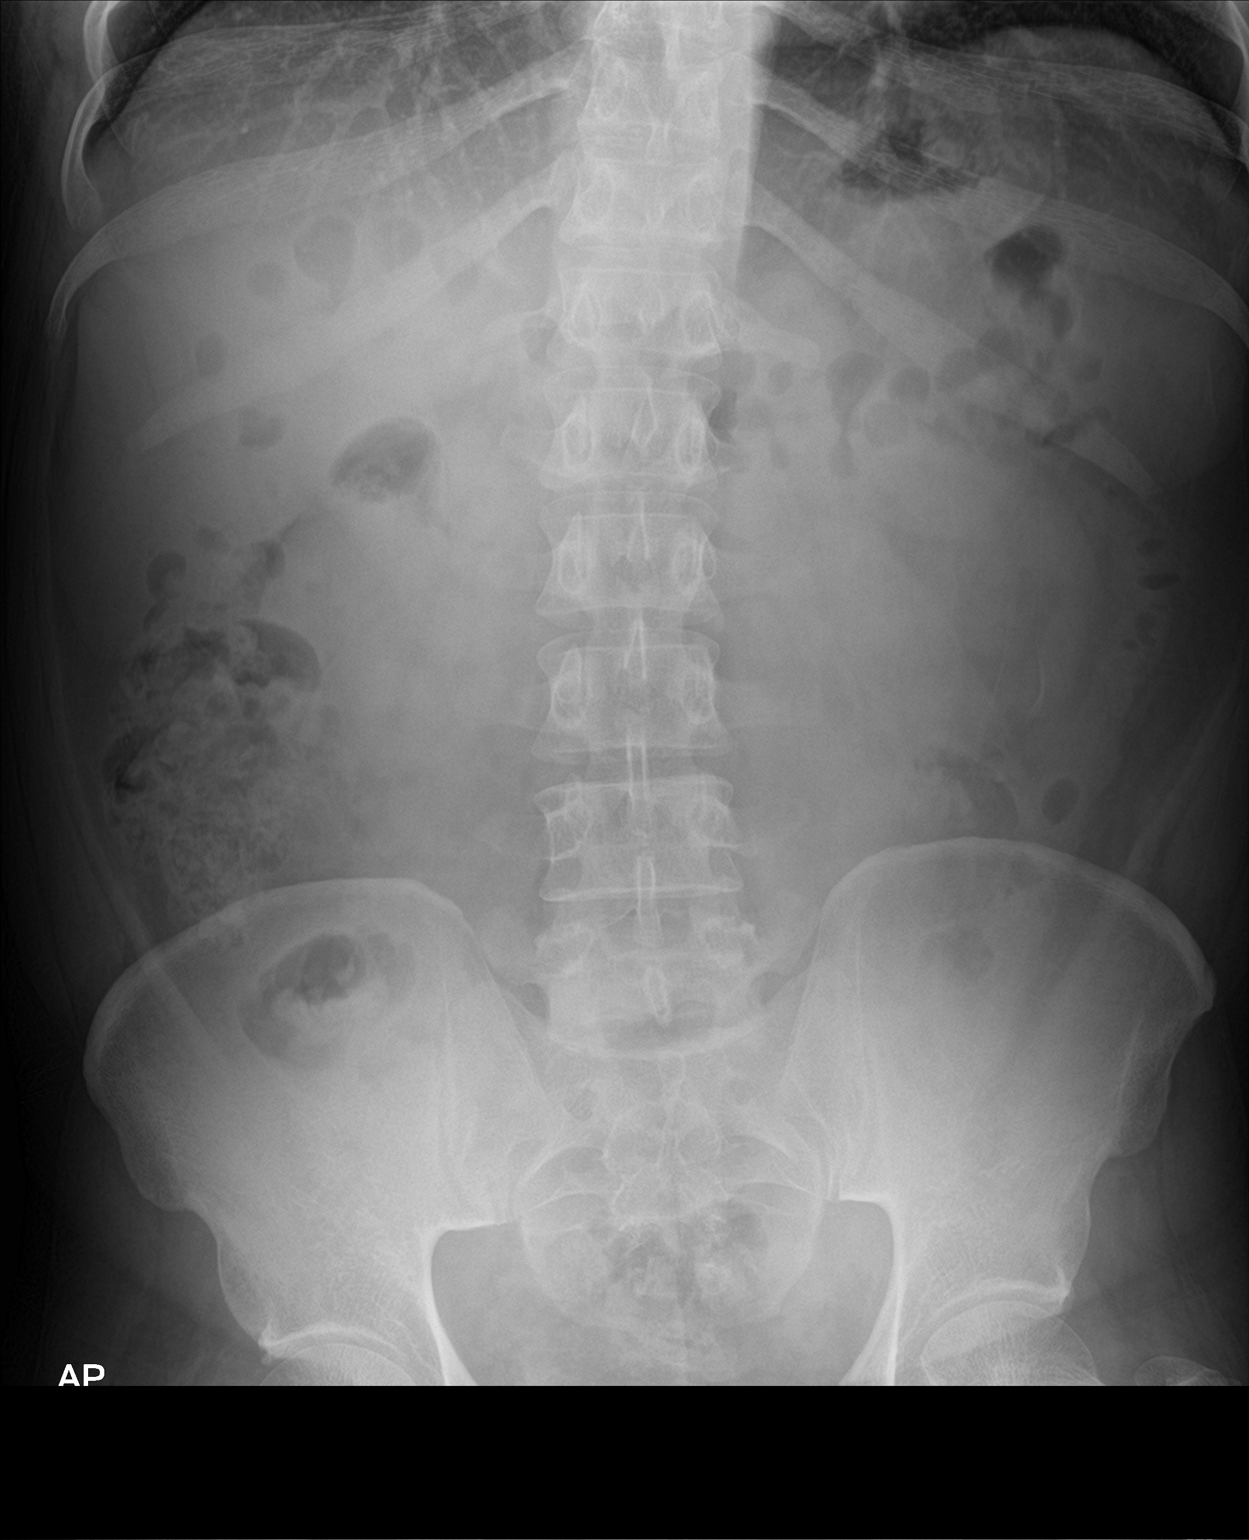

[abdomen supine]
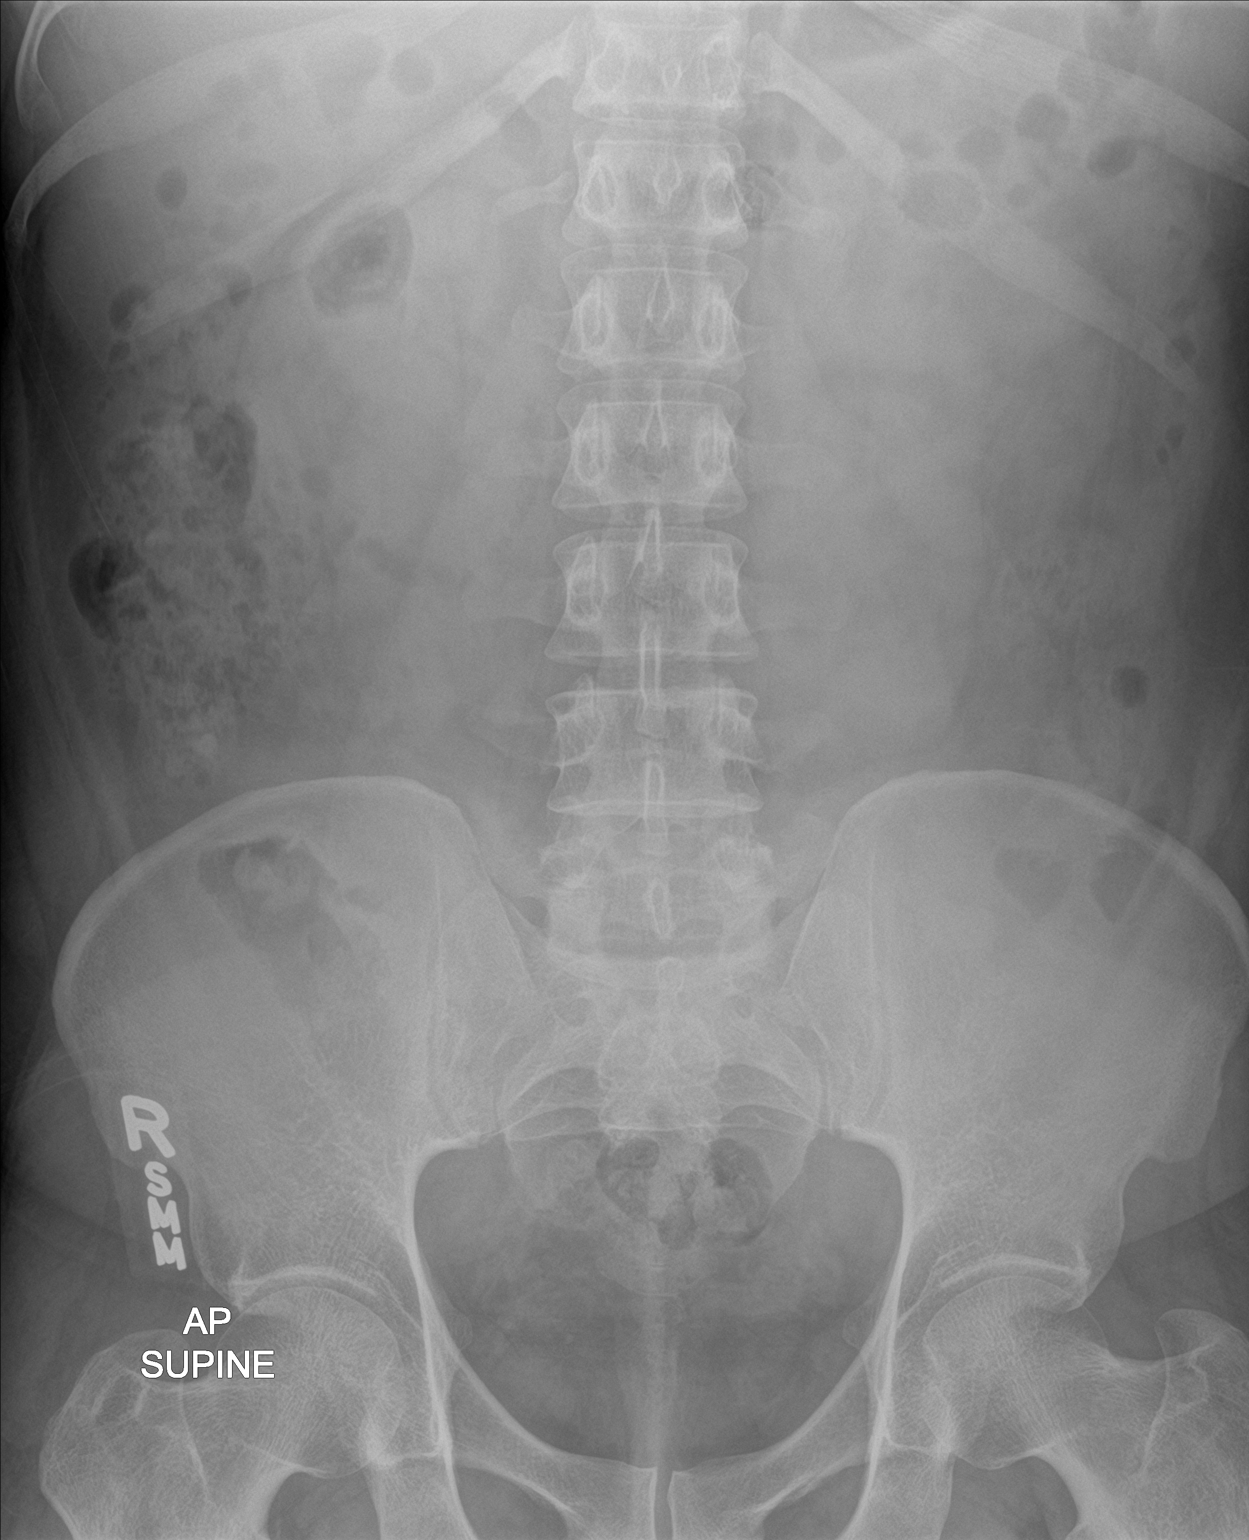

[2 of 2 positions shown; findings below may reference images not displayed]

FINDINGS: The bowel gas pattern is normal. There is no evidence of free air.
No radio-opaque calculi or other significant radiographic
abnormality is seen.
IMPRESSION: Negative exam.

## 2023-04-08 ENCOUNTER — Encounter (HOSPITAL_BASED_OUTPATIENT_CLINIC_OR_DEPARTMENT_OTHER): Payer: Self-pay | Admitting: Emergency Medicine

## 2023-04-08 ENCOUNTER — Emergency Department (HOSPITAL_BASED_OUTPATIENT_CLINIC_OR_DEPARTMENT_OTHER)
Admission: EM | Admit: 2023-04-08 | Discharge: 2023-04-08 | Disposition: A | Discharge status: Home / Self Care | Payer: BC Managed Care – PPO | Attending: Emergency Medicine | Admitting: Emergency Medicine

## 2023-04-08 ENCOUNTER — Emergency Department (HOSPITAL_BASED_OUTPATIENT_CLINIC_OR_DEPARTMENT_OTHER): Payer: PRIVATE HEALTH INSURANCE

## 2023-04-08 ENCOUNTER — Other Ambulatory Visit: Payer: Self-pay

## 2023-04-08 DIAGNOSIS — K353 Acute appendicitis with localized peritonitis, without perforation or gangrene: Secondary | ICD-10-CM | POA: Diagnosis not present

## 2023-04-08 DIAGNOSIS — R1033 Periumbilical pain: Secondary | ICD-10-CM | POA: Diagnosis present

## 2023-04-08 LAB — CBC WITH DIFFERENTIAL/PLATELET
Abs Immature Granulocytes: 0.01 10*3/uL (ref 0.00–0.07)
Basophils Absolute: 0 10*3/uL (ref 0.0–0.1)
Basophils Relative: 0 %
Eosinophils Absolute: 0.1 10*3/uL (ref 0.0–0.5)
Eosinophils Relative: 2 %
HCT: 39.2 % (ref 39.0–52.0)
Hemoglobin: 12.9 g/dL — ABNORMAL LOW (ref 13.0–17.0)
Immature Granulocytes: 0 %
Lymphocytes Relative: 45 %
Lymphs Abs: 2.5 10*3/uL (ref 0.7–4.0)
MCH: 26.1 pg (ref 26.0–34.0)
MCHC: 32.9 g/dL (ref 30.0–36.0)
MCV: 79.4 fL — ABNORMAL LOW (ref 80.0–100.0)
Monocytes Absolute: 0.3 10*3/uL (ref 0.1–1.0)
Monocytes Relative: 5 %
Neutro Abs: 2.8 10*3/uL (ref 1.7–7.7)
Neutrophils Relative %: 48 %
Platelets: 207 10*3/uL (ref 150–400)
RBC: 4.94 MIL/uL (ref 4.22–5.81)
RDW: 13.3 % (ref 11.5–15.5)
WBC: 5.7 10*3/uL (ref 4.0–10.5)
nRBC: 0 % (ref 0.0–0.2)

## 2023-04-08 LAB — LIPASE, BLOOD: Lipase: 27 U/L (ref 11–51)

## 2023-04-08 LAB — COMPREHENSIVE METABOLIC PANEL
ALT: 47 U/L — ABNORMAL HIGH (ref 0–44)
AST: 27 U/L (ref 15–41)
Albumin: 4.3 g/dL (ref 3.5–5.0)
Alkaline Phosphatase: 49 U/L (ref 38–126)
Anion gap: 8 (ref 5–15)
BUN: 13 mg/dL (ref 6–20)
CO2: 28 mmol/L (ref 22–32)
Calcium: 9.6 mg/dL (ref 8.9–10.3)
Chloride: 105 mmol/L (ref 98–111)
Creatinine, Ser: 0.79 mg/dL (ref 0.61–1.24)
GFR, Estimated: >60 mL/min (ref >60)
Glucose, Bld: 123 mg/dL — ABNORMAL HIGH (ref 70–99)
Potassium: 3.7 mmol/L (ref 3.5–5.1)
Sodium: 141 mmol/L (ref 135–145)
Total Bilirubin: 0.4 mg/dL (ref 0.3–1.2)
Total Protein: 6.8 g/dL (ref 6.5–8.1)

## 2023-04-08 MED ORDER — AMOXICILLIN-POT CLAVULANATE 875-125 MG PO TABS: 1.0000 | ORAL_TABLET | Freq: Two times a day (BID) | ORAL | 0 refills | Status: AC

## 2023-04-08 MED ORDER — ONDANSETRON HCL 4 MG/2ML IJ SOLN
4.0000 mg | Freq: Once | INTRAMUSCULAR | Status: AC
  Administered 2023-04-08: 4 mg via INTRAVENOUS
  Filled 2023-04-08: qty 2

## 2023-04-08 MED ORDER — AMOXICILLIN-POT CLAVULANATE 875-125 MG PO TABS
1.0000 | ORAL_TABLET | Freq: Once | ORAL | Status: AC
  Administered 2023-04-08: 1 via ORAL
  Filled 2023-04-08: qty 1

## 2023-04-08 MED ORDER — IOHEXOL 300 MG/ML  SOLN
100.0000 mL | Freq: Once | INTRAMUSCULAR | Status: AC | PRN
  Administered 2023-04-08: 100 mL via INTRAVENOUS

## 2023-04-08 MED ORDER — FENTANYL CITRATE PF 50 MCG/ML IJ SOSY
50.0000 ug | PREFILLED_SYRINGE | Freq: Once | INTRAMUSCULAR | Status: AC
  Administered 2023-04-08: 50 ug via INTRAVENOUS
  Filled 2023-04-08: qty 1

## 2023-04-08 MED ORDER — METRONIDAZOLE 500 MG PO TABS: 500.0000 mg | ORAL_TABLET | Freq: Two times a day (BID) | ORAL | 0 refills | Status: AC

## 2023-04-08 MED ORDER — METRONIDAZOLE 500 MG PO TABS
500.0000 mg | ORAL_TABLET | Freq: Once | ORAL | Status: AC
  Administered 2023-04-08: 500 mg via ORAL
  Filled 2023-04-08: qty 1
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
## 2023-04-08 NOTE — ED Provider Notes (Signed)
[place] EMERGENCY DEPARTMENT AT [facility]  Provider Note  CSN: [id number] Arrival date & time: 04/08/23 1478  History Chief Complaint  Patient presents with   Abdominal Pain    Garrett Rosales is a 37 y.o. male who is approximately 6 weeks s/p umbilical hernia repair with mesh reports about 2 hours ago, his son jumped on his abdomen and he had sudden onset of severe pain, mostly in umbilicus but also more generally. No vomiting.    Home Medications Prior to Admission medications   Medication Sig Start Date End Date Taking? Authorizing Provider  amoxicillin-clavulanate (AUGMENTIN) 875-125 MG tablet Take 1 tablet by mouth every 12 (twelve) hours. 04/08/23  Yes Pollyann Savoy, MD  metroNIDAZOLE (FLAGYL) 500 MG tablet Take 1 tablet (500 mg total) by mouth 2 (two) times daily. 04/08/23  Yes Pollyann Savoy, MD  ibuprofen (ADVIL) 600 MG tablet Take 1 tablet (600 mg total) by mouth every 6 (six) hours as needed. 09/29/20   Domenick Gong, MD  KETOCONAZOLE, TOPICAL, 1 % SHAM Apply to affected rash, leave on for 5 minutes and rinse. Three times a week until it has resolved. 04/15/20   Georgetta Haber, NP  tiZANidine (ZANAFLEX) 4 MG tablet Take 1 tablet (4 mg total) by mouth every 8 (eight) hours as needed for muscle spasms. 09/29/20   Domenick Gong, MD     Allergies    Aspirin   Review of Systems   Review of Systems Please see HPI for pertinent positives and negatives  Physical Exam BP 129/86   Pulse 75   Temp 97.8 F (36.6 C) (Oral)   Resp 16   Wt 97.1 kg   SpO2 100%   BMI 30.71 kg/m   Physical Exam Vitals and nursing note reviewed.  Constitutional:      Appearance: Normal appearance.  HENT:     Head: Normocephalic and atraumatic.     Nose: Nose normal.     Mouth/Throat:     Mouth: Mucous membranes are moist.  Eyes:     Extraocular Movements: Extraocular movements intact.     Conjunctiva/sclera: Conjunctivae normal.  Cardiovascular:     Rate  and Rhythm: Normal rate.  Pulmonary:     Effort: Pulmonary effort is normal.     Breath sounds: Normal breath sounds.  Abdominal:     General: Abdomen is flat.     Palpations: Abdomen is soft.     Tenderness: There is abdominal tenderness (there is tender knot in his umbilicus) in the periumbilical area.  Musculoskeletal:        General: No swelling. Normal range of motion.     Cervical back: Neck supple.  Skin:    General: Skin is warm and dry.  Neurological:     General: No focal deficit present.     Mental Status: He is alert.  Psychiatric:        Mood and Affect: Mood normal.     ED Results / Procedures / Treatments   EKG None  Procedures Procedures  Medications Ordered in the ED Medications  amoxicillin-clavulanate (AUGMENTIN) 875-125 MG per tablet 1 tablet (has no administration in time range)  metroNIDAZOLE (FLAGYL) tablet 500 mg (has no administration in time range)  fentaNYL (SUBLIMAZE) injection 50 mcg (50 mcg Intravenous Given 04/08/23 0305)  ondansetron (ZOFRAN) injection 4 mg (4 mg Intravenous Given 04/08/23 0303)  iohexol (OMNIPAQUE) 300 MG/ML solution 100 mL (100 mLs Intravenous Contrast Given 04/08/23 0348)    Initial Impression and  Plan  Patient here with tenderness and knot in his umbilicus after recent hernia repair. Consider post-op changes vs internal wound dehiscence and recurrence of hernia. Will check labs and send for CT. Pain/nausea meds for comfort. Patient aware he will need to find a ride home if ultimately discharged.   ED Course   Clinical Course as of 04/08/23 0444  Sat Apr 08, 2023  0304 CBC is unremarkable.  [CS]  0325 CMP and lipase are unremarkable.  [CS]  [id number] I personally viewed the images from radiology studies and agree with radiologist interpretation: Spoke with Dr. Raynelle Dick, concern for early appendicitis. This does not correlate with the timing of the patient's pain starting suddenly when his son jumped on his abdomen, but he is  tender in the RLQ on re-exam.  I have discussed the CT findings with the patient who has refused admission or surgical consultation. I explained the potential for worsening if this is acute appendicitis and the possible complications of rupture, abscess, permanent disability and death. He understands those risks and still refuses. He is willing to take oral Abx as a temporizing measure and will follow up with [facility] General Sur[facility]. He was advised to RTED immediately if his pain worsens or if he changes his mind.   [CS]    Clinical Course User Index [CS] Pollyann Savoy, MD     MDM Rules/Calculators/A&P Medical Decision Making Problems Addressed: Acute appendicitis with localized peritonitis, without perforation, abscess, or gangrene: acute illness or injury  Amount and/or Complexity of Data Reviewed Labs: ordered. Decision-making details documented in ED Course. Radiology: ordered and independent interpretation performed. Decision-making details documented in ED Course.  Risk Prescription drug management. Parenteral controlled substances. Decision regarding hospitalization.     Final Clinical Impression(s) / ED Diagnoses Final diagnoses:  Acute appendicitis with localized peritonitis, without perforation, abscess, or gangrene    Rx / DC Orders ED Discharge Orders          Ordered    amoxicillin-clavulanate (AUGMENTIN) 875-125 MG tablet  Every 12 hours        04/08/23 0442    metroNIDAZOLE (FLAGYL) 500 MG tablet  2 times daily        04/08/23 0444             Pollyann Savoy, MD 04/08/23 [id number]

## 2023-04-08 NOTE — ED Notes (Signed)
Pt educated about appendicitis, discussed risks of leaving AMA including worsening condition or even death. Patient states understanding, continues to decline transfer. States "I just had surgery, I didn't come here for more surgery." Reviewed AVS with patient, patient expressed understanding of directions, denies further questions at this time.

## 2023-10-10 ENCOUNTER — Emergency Department (HOSPITAL_BASED_OUTPATIENT_CLINIC_OR_DEPARTMENT_OTHER)
Admission: EM | Admit: 2023-10-10 | Discharge: 2023-10-10 | Disposition: A | Discharge status: Home / Self Care | Payer: BC Managed Care – PPO | Attending: Emergency Medicine | Admitting: Emergency Medicine

## 2023-10-10 ENCOUNTER — Encounter (HOSPITAL_BASED_OUTPATIENT_CLINIC_OR_DEPARTMENT_OTHER): Payer: Self-pay | Admitting: Emergency Medicine

## 2023-10-10 ENCOUNTER — Other Ambulatory Visit: Payer: Self-pay

## 2023-10-10 DIAGNOSIS — Z7982 Long term (current) use of aspirin: Secondary | ICD-10-CM | POA: Diagnosis not present

## 2023-10-10 DIAGNOSIS — R030 Elevated blood-pressure reading, without diagnosis of hypertension: Secondary | ICD-10-CM | POA: Diagnosis present

## 2023-10-10 DIAGNOSIS — I10 Essential (primary) hypertension: Secondary | ICD-10-CM | POA: Insufficient documentation

## 2023-10-10 DIAGNOSIS — R519 Headache, unspecified: Secondary | ICD-10-CM

## 2023-10-10 DIAGNOSIS — Z79899 Other long term (current) drug therapy: Secondary | ICD-10-CM | POA: Diagnosis not present

## 2023-10-10 LAB — RESP PANEL BY RT-PCR (RSV, FLU A&B, COVID)  RVPGX2
Influenza A by PCR: NEGATIVE
Influenza B by PCR: NEGATIVE
Resp Syncytial Virus by PCR: NEGATIVE
SARS Coronavirus 2 by RT PCR: NEGATIVE

## 2023-10-10 MED ORDER — IBUPROFEN 800 MG PO TABS
800.0000 mg | ORAL_TABLET | Freq: Once | ORAL | Status: AC
  Administered 2023-10-10: 800 mg via ORAL
  Filled 2023-10-10: qty 1

## 2023-10-10 NOTE — ED Provider Notes (Signed)
[name] EMERGENCY DEPARTMENT AT [facility] Provider Note   CSN: [id number] Arrival date & time: 10/10/23  1534     History  Chief Complaint  Patient presents with   Headache    Garrett Rosales is a 38 y.o. male.  Patient presents from work with complaint of headache. Had temperature of 99.9 at work, and was noted to be mildly hypertensive. Patient has taken tylenol with mild improvement in headache. Originated in frontal area. Work sent patient to ED for COVID testing.   Headache Pain location:  Frontal Quality:  Dull Onset quality:  Sudden Progression:  Partially resolved Chronicity:  New      Home Medications Prior to Admission medications   Medication Sig Start Date End Date Taking? Authorizing Provider  amoxicillin-clavulanate (AUGMENTIN) 875-125 MG tablet Take 1 tablet by mouth every 12 (twelve) hours. 04/08/23   Pollyann Savoy, MD  ibuprofen (ADVIL) 600 MG tablet Take 1 tablet (600 mg total) by mouth every 6 (six) hours as needed. 09/29/20   Domenick Gong, MD  KETOCONAZOLE, TOPICAL, 1 % SHAM Apply to affected rash, leave on for 5 minutes and rinse. Three times a week until it has resolved. 04/15/20   Georgetta Haber, NP  metroNIDAZOLE (FLAGYL) 500 MG tablet Take 1 tablet (500 mg total) by mouth 2 (two) times daily. 04/08/23   Pollyann Savoy, MD  tiZANidine (ZANAFLEX) 4 MG tablet Take 1 tablet (4 mg total) by mouth every 8 (eight) hours as needed for muscle spasms. 09/29/20   Domenick Gong, MD      Allergies    Aspirin    Review of Systems   Review of Systems  Neurological:  Positive for headaches.  All other systems reviewed and are negative.   Physical Exam Updated Vital Signs BP (!) 132/101 (BP Location: Right Arm)   Pulse 65   Temp 98 F (36.7 C)   Resp 16   SpO2 100%  Physical Exam Vitals reviewed.  HENT:     Head: Normocephalic.  Eyes:     Extraocular Movements: Extraocular movements intact.     Pupils: Pupils are  equal, round, and reactive to light.  Cardiovascular:     Rate and Rhythm: Normal rate.  Pulmonary:     Effort: Pulmonary effort is normal.  Musculoskeletal:        General: Normal range of motion.     Cervical back: Normal range of motion and neck supple.  Skin:    General: Skin is warm and dry.  Neurological:     General: No focal deficit present.     Mental Status: He is alert and oriented to person, place, and time.     GCS: GCS eye subscore is 4. GCS verbal subscore is 5. GCS motor subscore is 6.     Cranial Nerves: No cranial nerve deficit or dysarthria.  Psychiatric:        Mood and Affect: Mood normal.        Behavior: Behavior normal.     ED Results / Procedures / Treatments   Labs (all labs ordered are listed, but only abnormal results are displayed) Labs Reviewed  RESP PANEL BY RT-PCR (RSV, FLU A&B, COVID)  [id number]    EKG None  Radiology No results found.  Procedures Procedures    Medications Ordered in ED Medications  ibuprofen (ADVIL) tablet 800 mg (has no administration in time range)    ED Course/ Medical Decision Making/ A&P  Medical Decision Making Risk Prescription drug management.   Pt HA treated and improved while in ED.  Presentation is not concerning for [facility], ICH, Meningitis, or temporal arteritis. Pt afebrile in ED with no focal neuro deficits, nuchal rigidity, or change in vision. Testing negative for COVID, influenza, and RSV.  Care instructions provided and return precautions discussed. Pt verbalizes understanding and is agreeable with plan to dc.          Final Clinical Impression(s) / ED Diagnoses Final diagnoses:  Acute nonintractable headache, unspecified headache type    Rx / DC Orders ED Discharge Orders     None         Felicie Morn, NP 10/10/23 Nicholos Johns    Vanetta Mulders, MD 10/12/23 1341

## 2023-10-10 NOTE — Discharge Instructions (Addendum)
Your testing for COVID by PCR is negative. Also negative for influenza and RSV. Please refer to the attached instructions.

## 2023-10-10 NOTE — ED Triage Notes (Signed)
Headache  temp 99.9 started today Took tylenol  Work suggest covid testing

## 2023-10-10 NOTE — ED Notes (Signed)
AVS provided by edp was reviewed with no additional questions at this time

## 2025-05-06 ENCOUNTER — Ambulatory Visit: Admitting: Physician Assistant
